# Patient Record
Sex: Female | Born: 1935 | Hispanic: No | Smoking: Never smoker
Health system: Southern US, Community
[De-identification: ages and names within clinical notes are randomized; demographics above are authoritative.]

---

## 2005-12-21 ENCOUNTER — Inpatient Hospital Stay (HOSPITAL_COMMUNITY): Admission: EM | Admit: 2005-12-21 | Discharge: 2005-12-21 | Payer: Self-pay | Admitting: Emergency Medicine

## 2010-01-02 ENCOUNTER — Emergency Department (HOSPITAL_COMMUNITY): Admission: EM | Admit: 2010-01-02 | Discharge: 2010-01-02 | Payer: Self-pay | Admitting: Emergency Medicine

## 2010-07-24 IMAGING — CR DG CHEST 1V PORT
1 series · 1 of 1 positions shown · non-contrast
Comparison: 12/20/2005.

CLINICAL DATA: 78-year-old female with dizziness.

PORTABLE CHEST - 1 VIEW

[AP]
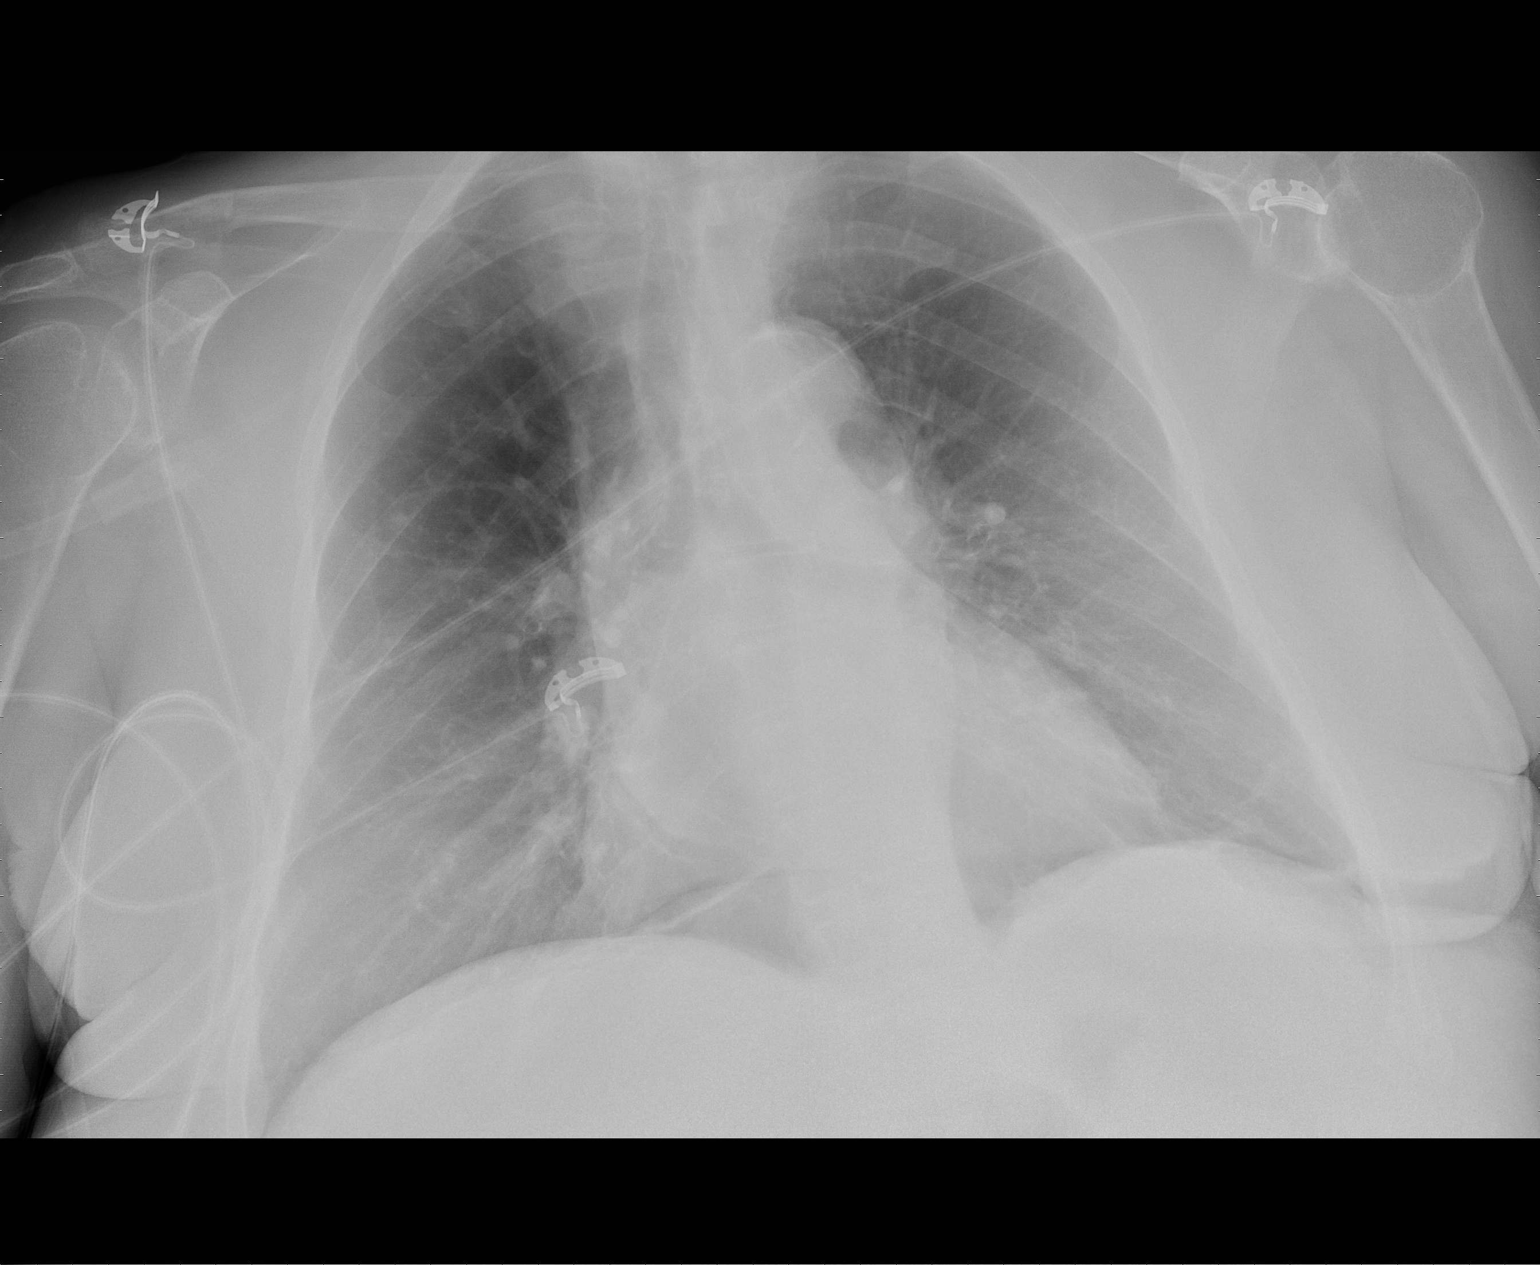

[1 of 1 positions shown; findings below may reference images not displayed]

FINDINGS: AP portable upright view 2883 hours.  Stable lung
volumes.  Cardiac size and mediastinal contours are within normal
limits.  No pneumothorax, pulmonary edema, pleural effusion,
consolidation, or confluent airspace opacity.
IMPRESSION: No acute cardiopulmonary abnormality.

## 2011-02-24 LAB — DIFFERENTIAL
Basophils Relative: 0 % (ref 0–1)
Eosinophils Absolute: 0.2 10*3/uL (ref 0.0–0.7)
Eosinophils Relative: 3 % (ref 0–5)
Lymphocytes Relative: 24 % (ref 12–46)

## 2011-02-24 LAB — URINE CULTURE: Colony Count: 35000

## 2011-02-24 LAB — COMPREHENSIVE METABOLIC PANEL
Albumin: 3.2 g/dL — ABNORMAL LOW (ref 3.5–5.2)
CO2: 29 mEq/L (ref 19–32)
Calcium: 8.9 mg/dL (ref 8.4–10.5)
Chloride: 108 mEq/L (ref 96–112)
Creatinine, Ser: 0.64 mg/dL (ref 0.4–1.2)
GFR calc non Af Amer: >60 mL/min (ref >60)
Potassium: 3.6 mEq/L (ref 3.5–5.1)

## 2011-02-24 LAB — URINALYSIS, ROUTINE W REFLEX MICROSCOPIC
Glucose, UA: NEGATIVE mg/dL
Ketones, ur: NEGATIVE mg/dL
Leukocytes, UA: NEGATIVE
Specific Gravity, Urine: 1.015 (ref 1.005–1.030)

## 2011-02-24 LAB — PROTIME-INR
INR: 0.98 (ref 0.00–1.49)
Prothrombin Time: 12.9 seconds (ref 11.6–15.2)

## 2011-02-24 LAB — POCT CARDIAC MARKERS: CKMB, poc: 1.6 ng/mL (ref 1.0–8.0)

## 2011-02-24 LAB — CBC
Hemoglobin: 12.7 g/dL (ref 12.0–15.0)
MCHC: 34.3 g/dL (ref 30.0–36.0)
RBC: 4.21 MIL/uL (ref 3.87–5.11)

## 2011-02-24 LAB — URINE MICROSCOPIC-ADD ON

## 2011-04-26 NOTE — H&P (Signed)
[name], [name] NO.:  [id number]   MEDICAL RECORD NO.:  [id number]          PATIENT TYPE:  EMS   LOCATION:  [facility]                         FACILITY:  [facility]   PHYSICIAN:  Isidor Holts, M.D.  DATE OF BIRTH:  [birth year]   DATE OF ADMISSION:  12/20/2005  DATE OF DISCHARGE:                                HISTORY & PHYSICAL   PRIMARY CARE PHYSICIAN:  Unassigned.   CHIEF COMPLAINT:  Faintness.   HISTORY OF PRESENT ILLNESS:  This is a 77 year old female, with known  history of hypertension and coronary artery disease.  The patient is [place]  and has poor command of English, so history is supplied by her son.  Over  the last 12 months, the patient has had intermittent diarrhea with watery  stools, sometimes up to 4 times a day, associated with abdominal discomfort  but no vomiting.  On December 19, 2005, she had another episode of watery  diarrhea.  This continued on December 20, 2005.  At about 4:00 p.m., the  patient had an episode of angina with central chest pain radiating to her  jaw.  She took her sublingual nitroglycerin with prompt relief and went to  the bathroom to move her bowels.  When she came out of the bathroom, she  felt weak and lightheaded with blurred vision.  No chest pain, moving all  her limbs.  Speech was slow but not slurred.  There was no loss of  consciousness, no fall.  [facility].   PAST MEDICAL HISTORY:  1.  Chronic stable angina for approximately 10 years.  2.  Coronary artery disease status post cardiac catheterization 2005 in      [place]. No obstructive coronary artery disease reportedly.  3.  Hypertension.  4.  Status post cataract surgery approximately 1 month ago.  5.  GERD.   MEDICATIONS:  1.  Sublingual nitroglycerin.  2.  Aspirin, low dose.  3.  Isosorbide mononitrate, query dose.  4.  Isoptin (verapamil) query dose.  5.  Proton pump inhibitor, query which.   ALLERGIES:  The patient  reacted to an ANTIBIOTIC after cataract surgery  approximately 1 months ago.  The patient does not know which antibiotic that  was.   REVIEW OF SYSTEMS:  As in HPI and Chief Complaint.  There is no fever, no  respiratory tract symptoms.  Review of Systems is otherwise negative.   SOCIAL HISTORY:  The patient is visiting her offspring from [place].  She  arrived on November 25, 2005.  She is a nonsmoker, drinks alcohol on  occasion.  No history of drug abuse.   FAMILY HISTORY:  Noncontributory.   PHYSICAL EXAMINATION:  VITAL SIGNS: Temperature 98.7, pulse 77 per minute  and regular, respiratory rate 20, blood pressure initially 94/54 mmHg.  After 500 mL normal saline bolus in the emergency department, rechecked at  105/61 mmHg.  Pulse oximetry 96% on room air.  GENERAL:  The patient does not appear to be in obvious acute distress. Alert  and oriented, communicative.  Not short of breath at rest.  HEENT:  No  clinical pallor, no jaundice, no conjunctival injection.  Throat  appears quite clear.  NECK:  Supple.  JVP not seen. No palpable lymphadenopathy, no palpable  goiter, no carotid bruits.  CHEST:  Clinically clear to auscultation.  No wheezes, no crackles.  Heart  sounds 1 and 2 heard, normal, regular, no murmurs.  ABDOMEN:  Moderately obese, soft, nontender.  There is no palpable  organomegaly, no palpable masses.  Normal bowel sounds.  EXTREMITIES:  Lower extremities exam shows no pitting edema.  Palpable  peripheral pulses.  MUSCULOSKELETAL: Examination was quite unremarkable.  CENTRAL NERVOUS SYSTEM:  No focal neurologic deficits on gross examination.   INVESTIGATIONS:  CBC: WBC 8.4, hemoglobin 10.3, hematocrit 38.7, platelets  348.  Electrolytes: Sodium 138, potassium 0.5, chloride 108, CO2 23, BUN 13,  creatinine 0.7, glucose 107.  LFTs are normal. Troponin I less than 0.05.   Chest x-ray shows no acute cardiopulmonary disease.   EKG shows sinus rhythm, regular, normal  axis, 57 per minute, generalized T  wave flattening in anterolateral leads.   ASSESSMENT AND PLAN:  1.  Presyncope: This is likely secondary to postural hypotension, associated      with dehydration from diarrhea and superadded effect of nitrate.  Has      responded to intravenous normal saline bolus in the emergency      department.  We shall admit the patient for observation, rehydration,      and check orthostatics in the morning of December 21, 2005.   1.  Diarrhea. Query etiology. Stool sent for C. difficile toxin, microscopy,      and fecal leukocytes.   1.  Hypertension: We shall hold antihypertensives for now, given patient's      postural hypotension.   at point-of-care, are negative. The patient will continue aspirin and low-  dose Imdur.  Meanwhile, cycle cardiac enzymes for completeness.   Further management will depend on clinical course.      Isidor Holts, M.D.  Electronically Signed     CO/MEDQ  D:  12/20/2005  T:  12/22/2005  Job:  [id number]

## 2011-04-26 NOTE — Discharge Summary (Signed)
[name], [name]             ACCOUNT NO.:  [id number]   MEDICAL RECORD NO.:  [id number]          PATIENT TYPE:  INP   LOCATION:  3737                         FACILITY:  [facility]   PHYSICIAN:  Lonia Blood, M.D.      DATE OF BIRTH:  [birth year]   DATE OF ADMISSION:  12/20/2005  DATE OF DISCHARGE:  12/21/2005                                 DISCHARGE SUMMARY   PRIMARY CARE PHYSICIAN:  Patient is unassigned to [place].   DISCHARGE DIAGNOSES:  1.  Presyncope secondary to dehydration.  2.  Acute on chronic diarrhea.  3.  Hypertension.  4.  Coronary artery disease.   DISCHARGE MEDICATIONS:  1.  Imdur 30 mg daily.  2.  Imodium 2 mg after each bowel movement, no more than 4 tablets in 24      hours.  3.  The patient was also asked to resume her other home medications which      include verapamil, proton-pump inhibitor, and sublingual nitroglycerin.   PROCEDURES PERFORMED:  A chest x-ray performed on December 20, 2005 showed no  acute cardiopulmonary disease.   CONSULTS:  None.   BRIEF HISTORY AND PHYSICAL:  Please refer to dictated history and physical  by Dr. Isidor Holts.  In short, however, this is a 77 year old female  with history of hypertension, coronary artery disease.  She is [place] by  origin with poor command of [place].  Patient was brought in by her family.  She was visiting from [place] and apparently had some ongoing diarrhea that  started in [place].  She was supposed to have had work-up there, but traveled  to the [place].  While here her diarrhea got worse and patient was  becoming lightheaded and dry, hence, she was brought in by her family.  In  the ED she was found to be mildly orthostatic, also elements of dehydration  clinically.  She was continuously having diarrhea.  She was admitted for  further management.   HOSPITAL COURSE:  #1 - PRESYNCOPE:  This was presumed to be secondary to  dehydration.  Patient received normal boluses of fluid and was positive  over  4 L in the 24 hours she was here.  She felt much better and she and family  insisted that they wanted her discharged so she could go home since she was  getting ready to go back to [place].  Because of her presyncope was thought to  be the diarrheal illness; however, they did not want any work-up here since  she is already in the process of getting work-up in [place].  She was  subsequently discharged in good health after hydration with instructions to  take as much fluid as possible.   #2 - CHRONIC DIARRHEA:  As indicated above, the etiology is not clear.  Basic work-up including stool for O&P and C. difficile was negative here.  Malabsorption is a good possibility with chronicity of the diarrhea but she  will have a full work-up done back in [place].   #3 - HYPERTENSION:  Her blood pressure was stable.  Her antihypertensives  were held initially  due to her dehydration.  Prior to discharge, however,  patient was placed back on antihypertensives since she was fully hydrated.   #4 - CORONARY ARTERY DISEASE AND CHRONIC STABLE ANGINA:  This was also  stable this admission without any problem.  Further management was deferred  until she sees her primary care physician.      Lonia Blood, M.D.  Electronically Signed     LG/MEDQ  D:  01/29/2006  T:  01/30/2006  Job:  [id number]

## 2016-12-11 ENCOUNTER — Encounter: Payer: Self-pay | Admitting: Vascular Surgery

## 2016-12-11 ENCOUNTER — Other Ambulatory Visit: Payer: Self-pay | Admitting: *Deleted

## 2016-12-11 ENCOUNTER — Ambulatory Visit (HOSPITAL_COMMUNITY)
Admission: RE | Admit: 2016-12-11 | Discharge: 2016-12-11 | Disposition: A | Discharge status: Home / Self Care | Payer: PRIVATE HEALTH INSURANCE | Source: Ambulatory Visit | Attending: Vascular Surgery | Admitting: Vascular Surgery

## 2016-12-11 ENCOUNTER — Ambulatory Visit (INDEPENDENT_AMBULATORY_CARE_PROVIDER_SITE_OTHER): Payer: PRIVATE HEALTH INSURANCE | Admitting: Vascular Surgery

## 2016-12-11 VITALS — BP 189/100 | HR 64 | Temp 96.7°F | Resp 16 | Ht 61.0 in | Wt 162.0 lb

## 2016-12-11 DIAGNOSIS — I8001 Phlebitis and thrombophlebitis of superficial vessels of right lower extremity: Secondary | ICD-10-CM | POA: Diagnosis not present

## 2016-12-11 DIAGNOSIS — M7989 Other specified soft tissue disorders: Secondary | ICD-10-CM | POA: Insufficient documentation

## 2016-12-11 DIAGNOSIS — M79604 Pain in right leg: Secondary | ICD-10-CM

## 2016-12-11 NOTE — Progress Notes (Signed)
Patient name: [name] MRN: [id number] DOB: 1931/07/14 Sex: female  REASON FOR CONSULT: Phlebitis right leg  HPI: [name] is a 83 y.o. female, from [place], with a long history of varicose veins and chronic venous insufficiency. The history is obtained from her mother as she does not speak [place]. She has had recurrent episodes of phlebitis in the same area on the posterior aspect of her right calf. She developed this most recent episode proximally a month ago and has had persistent pain in her posterior right calf. This is an area where she's previously had a varicose vein. She is unaware of any previous history of DVT.  No past medical history on file. She does have a history of hypertension. There is no history of diabetes that I'm aware of.  No family history on file.   SOCIAL HISTORY: Social History   Social History  . Marital status: Widowed    Spouse name: N/A  . Number of children: N/A  . Years of education: N/A   Occupational History  . Not on file.   Social History Main Topics  . Smoking status: Never Smoker  . Smokeless tobacco: Never Used  . Alcohol use Not on file  . Drug use: Unknown  . Sexual activity: Not on file   Other Topics Concern  . Not on file   Social History Narrative  . No narrative on file    Not on File  No current outpatient prescriptions on file.   No current facility-administered medications for this visit.     REVIEW OF SYSTEMS:  [X]  denotes positive finding, [ ]  denotes negative finding Cardiac  Comments:  Chest pain or chest pressure:    Shortness of breath upon exertion:    Short of breath when lying flat:    Irregular heart rhythm:        Vascular    Pain in calf, thigh, or hip brought on by ambulation:    Pain in feet at night that wakes you up from your sleep:     Blood clot in your veins:    Leg swelling:         Pulmonary    Oxygen at home:    Productive cough:     Wheezing:           Neurologic    Sudden weakness in arms or legs:     Sudden numbness in arms or legs:     Sudden onset of difficulty speaking or slurred speech:    Temporary loss of vision in one eye:     Problems with dizziness:         Gastrointestinal    Blood in stool:     Vomited blood:         Genitourinary    Burning when urinating:     Blood in urine:        Psychiatric    Major depression:         Hematologic    Bleeding problems:    Problems with blood clotting too easily:        Skin    Rashes or ulcers: X Wound posterior lateral aspect of right leg.       Constitutional    Fever or chills:      PHYSICAL EXAM: Vitals:   12/11/16 0959 12/11/16 1007  BP: (!) 183/99 (!) 189/100  Pulse: 64   Resp: 16   Temp: (!) 96.7 F (35.9 C)  TempSrc: Oral   SpO2: 95%   Weight: 162 lb (73.5 kg)   Height: 5\' 1"  (1.549 m)     GENERAL: The patient is a well-nourished female, in no acute distress. The vital signs are documented above. CARDIAC: There is a regular rate and rhythm.  VASCULAR: I do not detect carotid bruits. On the right side, which is the side with the wound, she has a palpable posterior tibial pulse and a weakly palpable dorsalis pedis pulse. She has a biphasic anterior tibial signal and a biphasic posterior tibial signal with the Doppler. On the left side, she has a palpable dorsalis pedis and posterior tibial pulse. She has a biphasic anterior tibial signal and a biphasic dorsalis pedis signal. PULMONARY: There is good air exchange bilaterally without wheezing or rales. ABDOMEN: Soft and non-tender with normal pitched bowel sounds.  MUSCULOSKELETAL: There are no major deformities or cyanosis. NEUROLOGIC: No focal weakness or paresthesias are detected. SKIN: She has an eschar on the posterior lateral aspect of her right leg which is a proximally 2 cm in diameter. This is very tender with some surrounding cellulitis. She does have hyperpigmentation bilaterally consistent  with chronic venous insufficiency.  PSYCHIATRIC: The patient has a normal affect.  DATA:   RIGHT LOWER EXTREMITY VENOUS DUPLEX: I have independently interpreted her venous duplex scan. She has no evidence of DVT of the right lower extremity. There is no thrombus in the great saphenous vein or small saphenous vein. There is a Veress in the mid calf but does have thrombus which is the site of her problem.  MEDICAL ISSUES:  PHLEBITIS RIGHT POSTERIOR CALF: This patient has phlebitis in the posterior right calf with a fairly extensive wound and an overlying eschar. Given the cellulitis I put her on Keflex. We have discussed the importance of elevating the leg and applying bacitracin to the wound daily. She can also tolerate some mild compression with an Ace bandage. We also discussed warm compresses. I will see her back as needed. I suspect this wound will take several months to heal. She could be considered for elective excision of this varicose vein once it is healed given the recurrent problems that she has had.   Waverly Ferrariickson, [facility] [place] [facility] [phone]

## 2016-12-23 ENCOUNTER — Ambulatory Visit: Payer: PRIVATE HEALTH INSURANCE

## 2016-12-23 DIAGNOSIS — L97909 Non-pressure chronic ulcer of unspecified part of unspecified lower leg with unspecified severity: Principal | ICD-10-CM

## 2016-12-23 DIAGNOSIS — I83009 Varicose veins of unspecified lower extremity with ulcer of unspecified site: Secondary | ICD-10-CM

## 2016-12-23 NOTE — Progress Notes (Signed)
Pt came in today for application of [employer].  She has a dime sized wound on the lateral part of her leg.  She complained of some pain when I took off the dressing in preparation for the [employer]. The pain subsided immediately after.  Patient tolerated the [employer] well and will follow up with us if there is any problems.  Her family came with her today and wants to know if she can get an order for Lovenox to take for her plane ride back to [place] on 12-31-16.  Her weight today is 163 Lbs.  Robet LeuAshleigh Jonhatan Hearty, LPN.

## 2016-12-27 ENCOUNTER — Other Ambulatory Visit: Payer: Self-pay | Admitting: *Deleted

## 2016-12-27 DIAGNOSIS — Z789 Other specified health status: Secondary | ICD-10-CM

## 2016-12-27 MED ORDER — ENOXAPARIN SODIUM 150 MG/ML ~~LOC~~ SOLN: 1.0000 mg/kg | SUBCUTANEOUS | 0 refills | Status: AC
